# Patient Record
Sex: Female | Born: 2005
Health system: Southern US, Community
[De-identification: ages and names within clinical notes are randomized; demographics above are authoritative.]

## PROBLEM LIST (undated history)

## (undated) DIAGNOSIS — F32A Depression, unspecified: Secondary | ICD-10-CM

## (undated) DIAGNOSIS — F419 Anxiety disorder, unspecified: Secondary | ICD-10-CM

## (undated) DIAGNOSIS — J45909 Unspecified asthma, uncomplicated: Secondary | ICD-10-CM

## (undated) DIAGNOSIS — F329 Major depressive disorder, single episode, unspecified: Secondary | ICD-10-CM

## (undated) DIAGNOSIS — R519 Headache, unspecified: Secondary | ICD-10-CM

## (undated) HISTORY — PX: TONSILLECTOMY: SUR1361

---
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
## 2024-01-09 ENCOUNTER — Ambulatory Visit: Payer: Self-pay

## 2024-02-26 ENCOUNTER — Ambulatory Visit
Admission: RE | Admit: 2024-02-26 | Discharge: 2024-02-26 | Disposition: A | Discharge status: Home / Self Care | Source: Ambulatory Visit | Attending: Family Medicine | Admitting: Family Medicine

## 2024-02-26 ENCOUNTER — Ambulatory Visit (INDEPENDENT_AMBULATORY_CARE_PROVIDER_SITE_OTHER)

## 2024-02-26 ENCOUNTER — Other Ambulatory Visit: Payer: Self-pay

## 2024-02-26 VITALS — BP 125/77 | HR 79 | Temp 98.3°F | Resp 18

## 2024-02-26 DIAGNOSIS — M79644 Pain in right finger(s): Secondary | ICD-10-CM

## 2024-02-26 MED ORDER — DICLOFENAC SODIUM 75 MG PO TBEC: 75.0000 mg | DELAYED_RELEASE_TABLET | Freq: Two times a day (BID) | ORAL | 0 refills | Status: AC

## 2024-02-26 NOTE — ED Triage Notes (Addendum)
 Got hurt changing a bar at the gym. It is now swollen and a bit purple. Hurts anytime I touch it against something. - Entered by patient  Injured at gym yesterday. Swelling and bruising noted to right ring finger. Abrasion to right middle finger, no active bleeding. States she was changing the bar on the lat pull down machine and her finger got caught in the ring and was pulled.

## 2024-02-26 NOTE — Discharge Instructions (Signed)
 Wear your finger splint for up to one week. May remove occasionally.

## 2024-02-26 NOTE — ED Provider Notes (Signed)
 [facility]   [id number] 02/26/24 Arrival Time: 1656  ASSESSMENT & PLAN:  1. Pain in right finger(s)    I have personally viewed and independently interpreted the imaging studies ordered this visit. R hand: no acute bony abnormalities appreciated.  Begin: New Prescriptions   DICLOFENAC  (VOLTAREN ) 75 MG EC TABLET    Take 1 tablet (75 mg total) by mouth 2 (two) times daily.    Orders Placed This Encounter  Procedures   DG Hand Complete Right   Apply finger splint static  - for comfort  Work/school excuse note: not needed. Recommend:  Follow-up Information     [facility]).   Specialty: Urgent Care Why: If worsening or failing to improve as anticipated. Contact information: [address]   [address] [phone]                Reviewed expectations re: course of current medical issues. Questions answered. Outlined signs and symptoms indicating need for more acute intervention. Patient verbalized understanding. After Visit Summary given.  SUBJECTIVE: History from: patient. Laurie Jimenez is a 18 y.o. female who reports pain of R 3rd and 4th distal fingers; mainly 4th. Reprots changing a bar at the gym and getting fingers temporarily caught. Some swelling now. Denies extremity sensation changes or weakness.  No tx PTA.  Past Surgical History:  Procedure Laterality Date   TONSILLECTOMY        OBJECTIVE:  Vitals:   02/26/24 1701  BP: 125/77  Pulse: 79  Resp: 18  Temp: 98.3 F (36.8 C)  TempSrc: Oral  SpO2: 99%    General appearance: alert; no distress HEENT: [place]; AT Neck: supple with FROM Resp: unlabored respirations Extremities: RUE: warm with well perfused appearance; fairly well localized moderate tenderness over right distal 3rd and 4th fingers, mainly 4th; without gross deformities; swelling: minimal; bruising: none; all fingers with FROM; normal distal  sensation Psychological: alert and cooperative; normal mood and affect  Imaging: DG Hand Complete Right Result Date: 02/26/2024 CLINICAL DATA:  Swelling bruising of the right ring finger and abrasion to the right middle finger after injury at the gym EXAM: RIGHT HAND - COMPLETE 3 VIEW COMPARISON:  None Available. FINDINGS: There is no evidence of fracture or dislocation. There is no evidence of arthropathy or other focal bone abnormality. Soft tissues are unremarkable. IMPRESSION: No acute fracture or dislocation. Electronically Signed   By: Limin  Xu M.D.   On: 02/26/2024 17:39      Allergies  Allergen Reactions   Lidocaine Palpitations    Past Medical History:  Diagnosis Date   Anxiety    Asthma    Depression    Headache    Social History   Socioeconomic History   Marital status: Single    Spouse name: Not on file   Number of children: Not on file   Years of education: Not on file   Highest education level: Not on file  Occupational History   Not on file  Tobacco Use   Smoking status: Never   Smokeless tobacco: Never  Vaping Use   Vaping status: Never Used  Substance and Sexual Activity   Alcohol use: Not Currently    Comment: once per month per pt   Drug use: Not Currently    Types: Marijuana   Sexual activity: Yes    Birth control/protection: None  Other Topics Concern   Not on file  Social History Narrative   Not on  file   Social Drivers of Health   Financial Resource Strain: Not on file  Food Insecurity: Low Risk  (01/13/2024)   Received from [facility]   Hunger Vital Sign    Within the past 12 months, you worried that your food would run out before you got money to buy more: Never true    Within the past 12 months, the food you bought just didn't last and you didn't have money to get more. : Never true  Transportation Needs: No Transportation Needs (01/13/2024)   Received from [facility]    In the past 12 months, has lack of  reliable transportation kept you from medical appointments, meetings, work or from getting things needed for daily living? : No  Physical Activity: Not on file  Stress: Not on file  Social Connections: Not on file   Family History  Family history unknown: Yes   Past Surgical History:  Procedure Laterality Date   TONSILLECTOMY         Rolinda Rogue, MD 02/26/24 [phone]

## 2024-04-16 DIAGNOSIS — G43709 Chronic migraine without aura, not intractable, without status migrainosus: Secondary | ICD-10-CM | POA: Insufficient documentation

## 2024-04-16 DIAGNOSIS — M778 Other enthesopathies, not elsewhere classified: Secondary | ICD-10-CM | POA: Insufficient documentation

## 2024-06-07 ENCOUNTER — Encounter: Payer: Self-pay | Admitting: Emergency Medicine

## 2024-06-07 ENCOUNTER — Ambulatory Visit: Admission: EM | Admit: 2024-06-07 | Discharge: 2024-06-07 | Disposition: A | Discharge status: Home / Self Care

## 2024-06-07 DIAGNOSIS — J069 Acute upper respiratory infection, unspecified: Secondary | ICD-10-CM | POA: Diagnosis not present

## 2024-06-07 LAB — POC COVID19/FLU A&B COMBO
Covid Antigen, POC: NEGATIVE
Influenza A Antigen, POC: NEGATIVE
Influenza B Antigen, POC: NEGATIVE

## 2024-06-07 NOTE — ED Triage Notes (Signed)
 Pt presents c/o URI x 3 days. Pt states,  Saturday night I started to get body aches but I took Tylenol  and that went away. I've had a cough, runny nose, and headaches as well.  Pt denies emesis and diarrhea.

## 2024-06-07 NOTE — ED Provider Notes (Signed)
 [facility]    CSN: [id number] Arrival date & time: 06/07/24  9164      History   Chief Complaint Chief Complaint  Patient presents with   URI    HPI Laurie Jimenez is a 18 y.o. female.   Patient presents today due to nasal congestion and cough that started on Friday.  Patient states that she took Tylenol  and ibuprofen  for symptoms with no significant relief.  Patient denies fever but states that she experienced chills.  Patient is eating and drinking without issue and denies shortness of breath.  The history is provided by the patient.  URI   Past Medical History:  Diagnosis Date   Anxiety    Asthma    Depression    Headache     Patient Active Problem List   Diagnosis Date Noted   Chronic migraine without aura without status migrainosus, not intractable 04/16/2024   Left wrist tendinitis 04/16/2024   Combined abdominal and pelvic pain 10/02/2023   Dyspareunia, female 10/02/2023   Non-specific low back pain 10/02/2023   Panic disorder (episodic paroxysmal anxiety) 08/26/2023   Mixed obsessional thoughts and acts 08/26/2023   History of depression 06/13/2022   MDD (major depressive disorder), recurrent severe, without psychosis (HCC) 12/23/2018   Intentional overdose of selective serotonin reuptake inhibitor (SSRI) (HCC) 12/23/2018   Self-injurious behavior 12/23/2018   Cannabis use disorder, mild, abuse 12/23/2018    Past Surgical History:  Procedure Laterality Date   TONSILLECTOMY      OB History   No obstetric history on file.      Home Medications    Prior to Admission medications   Medication Sig Start Date End Date Taking? Authorizing Provider  albuterol (PROVENTIL) (2.5 MG/3ML) 0.083% nebulizer solution USE 1 UNIT VIA NEBULIZER EVERY 4 TO 6 HOURS AS NEEDED 02/13/22  Yes [provider]  albuterol (VENTOLIN HFA) 108 (90 Base) MCG/ACT inhaler INHALE 2 PUFFS BY MOUTH EVERY 4 TO 6 AS NEEDED 02/13/22  Yes [provider]   cetirizine (ZYRTEC) 10 MG tablet Take 10 mg by mouth daily. 03/14/22 03/16/25 Yes [provider]  fluconazole (DIFLUCAN) 150 MG tablet Take 150 mg by mouth once. 05/06/24  Yes [provider]  FLUoxetine (PROZAC) 10 MG capsule Take 10 mg by mouth every morning. 05/18/24  Yes [provider]  FLUoxetine (PROZAC) 20 MG capsule Take 20 mg by mouth every morning. 05/18/24  Yes [provider]  fluticasone (FLONASE) 50 MCG/ACT nasal spray INSTILL 1 SPRAY INTO THE EFFECTED NOSTRILS TWICE DAILY 06/13/22  Yes [provider]  ibuprofen  (ADVIL ) 600 MG tablet Take 600 mg by mouth. 01/13/24  Yes [provider]  loratadine-pseudoephedrine (CLARITIN-D 12-HOUR) 5-120 MG tablet Take 1 tablet by mouth. 06/13/22  Yes [provider]  Norgestimate-Eth Estradiol (TRI-LO-MILI) 0.18/0.215/0.25 MG-25 MCG TABS Take 1 tablet by mouth daily. 01/31/22  Yes [provider]  Olopatadine HCl 0.2 % SOLN Apply 1 drop to eye. 01/13/24  Yes [provider]  rizatriptan (MAXALT) 10 MG tablet Take 10 mg by mouth. 04/16/24 04/16/25 Yes [provider]  topiramate (TOPAMAX) 25 MG tablet Take 50 mg by mouth. 04/16/24  Yes [provider]  diclofenac  (VOLTAREN ) 75 MG EC tablet Take 1 tablet (75 mg total) by mouth 2 (two) times daily. 02/26/24   Rolinda Rogue, MD  escitalopram  (LEXAPRO ) 10 MG tablet Take 1 tablet by mouth daily.    [provider]  hydrOXYzine  (ATARAX ) 25 MG tablet Take 25 mg  by mouth.    [provider]  omeprazole (PRILOSEC) 20 MG capsule Take 20 mg by mouth daily.    [provider]  ondansetron  (ZOFRAN ) 4 MG tablet Take 1 tablet (4 mg total) by mouth every 6 (six) hours. 07/15/22   Ettie Gull, MD  ondansetron  (ZOFRAN -ODT) 4 MG disintegrating tablet Take 1 tablet (4 mg total) by mouth every 8 (eight) hours as needed for nausea or vomiting. 06/14/23   Beverley Leita LABOR, PA-C  pantoprazole  (PROTONIX ) 40  MG tablet Take 1 tablet (40 mg total) by mouth daily. 06/14/23 07/14/23  Beverley Leita LABOR, PA-C    Family History Family History  Family history unknown: Yes    Social History Social History   Tobacco Use   Smoking status: Never    Passive exposure: Never   Smokeless tobacco: Never  Vaping Use   Vaping status: Never Used  Substance Use Topics   Alcohol use: Not Currently    Comment: once per month per pt   Drug use: Not Currently    Types: Marijuana     Allergies   Lidocaine   Review of Systems Review of Systems   Physical Exam Triage Vital Signs ED Triage Vitals  Encounter Vitals Group     BP 06/07/24 1041 107/69     Girls Systolic BP Percentile --      Girls Diastolic BP Percentile --      Boys Systolic BP Percentile --      Boys Diastolic BP Percentile --      Pulse Rate 06/07/24 1041 68     Resp 06/07/24 1041 16     Temp 06/07/24 1041 98.1 F (36.7 C)     Temp Source 06/07/24 1041 Oral     SpO2 06/07/24 1041 97 %     Weight 06/07/24 1041 113 lb 1.5 oz (51.3 kg)     Height --      Head Circumference --      Peak Flow --      Pain Score 06/07/24 1040 0     Pain Loc --      Pain Education --      Exclude from Growth Chart --    No data found.  Updated Vital Signs BP 107/69 (BP Location: Left Arm)   Pulse 68   Temp 98.1 F (36.7 C) (Oral)   Resp 16   Wt 113 lb 1.5 oz (51.3 kg)   LMP 06/05/2024 (Exact Date)   SpO2 97%   Visual Acuity Right Eye Distance:   Left Eye Distance:   Bilateral Distance:    Right Eye Near:   Left Eye Near:    Bilateral Near:     Physical Exam Vitals and nursing note reviewed.  Constitutional:      General: She is not in acute distress.    Appearance: Normal appearance. She is not ill-appearing, toxic-appearing or diaphoretic.  HENT:     Nose: Congestion (moderately enlarged turbinates) present. No rhinorrhea.     Mouth/Throat:     Mouth: Mucous membranes are moist.     Pharynx: Oropharynx is clear. No  oropharyngeal exudate or posterior oropharyngeal erythema.  Eyes:     General: No scleral icterus. Cardiovascular:     Rate and Rhythm: Normal rate and regular rhythm.     Heart sounds: Normal heart sounds.  Pulmonary:     Effort: Pulmonary effort is normal. No respiratory distress.     Breath sounds: Normal breath sounds. No wheezing or  rhonchi.  Skin:    General: Skin is warm.  Neurological:     Mental Status: She is alert and oriented to person, place, and time.  Psychiatric:        Mood and Affect: Mood normal.        Behavior: Behavior normal.      UC Treatments / Results  Labs (all labs ordered are listed, but only abnormal results are displayed) Labs Reviewed  POC COVID19/FLU A&B COMBO - Normal    EKG   Radiology No results found.  Procedures Procedures (including critical care time)  Medications Ordered in UC Medications - No data to display  Initial Impression / Assessment and Plan / UC Course  I have reviewed the triage vital signs and the nursing notes.  Pertinent labs & imaging results that were available during my care of the patient were reviewed by me and considered in my medical decision making (see chart for details).      Final Clinical Impressions(s) / UC Diagnoses   Final diagnoses:  Viral URI     Discharge Instructions      You been diagnosed with a viral illness today. -Viruses have to run their course and medicines that are prescribed are meant to help with symptoms. - With viruses usually feel poorly from 3 to 7 days with cough being the last symptoms to resolve.  -Cough can linger from days to weeks.  Antibiotics are not effective for viruses. -If your cough lasts more than 2 weeks and you are coughing so hard that you are vomiting or feel like you could pass out we need to follow-up with PCP for further testing and evaluation. -Rest, increase water intake, may use pseudoephedrine for nasal congestion, Delsym (dextromethorphan) or  honey as needed for cough, and ibuprofen  and/or Tylenol  as directed on packaging for pain and fever. -If you have hypertension you should take Coricidin or other OTC meds approved for people with high blood pressure. -You may use a spoonful of honey every 4-6 hours as needed for throat pain and cough. -Warm tea with honey and lemon are helpful for soothe throat as well.  Chloraseptic and Cepacol make a throat lozenge with numbing medication, can be purchased over-the-counter. -May also use Flonase or sinus rinse for sinus pressure or nasal congestion.  Be sure to use distilled bottled water for sinus rinses. -May use coolmist humidifier to open up nasal passages -May elevate head to assist with postnasal drainage. -If you feel poorly (fever, fatigue, shortness of breath, nausea, etc.) for more than 10 days to be sure to follow-up with PCP or in clinic for further evaluation and additional treatments. If you experience chest pain with shortness of breath or pulse oxygen less than 95% you should report to the ER.     ED Prescriptions   None    PDMP not reviewed this encounter.   Andra Corean BROCKS, PA-C 06/07/24 1121

## 2024-06-07 NOTE — Discharge Instructions (Signed)

## 2024-07-19 ENCOUNTER — Ambulatory Visit
Admission: EM | Admit: 2024-07-19 | Discharge: 2024-07-19 | Disposition: A | Discharge status: Home / Self Care | Attending: Student | Admitting: Student

## 2024-07-19 ENCOUNTER — Encounter: Payer: Self-pay | Admitting: *Deleted

## 2024-07-19 DIAGNOSIS — J069 Acute upper respiratory infection, unspecified: Secondary | ICD-10-CM | POA: Diagnosis not present

## 2024-07-19 DIAGNOSIS — Z3A01 Less than 8 weeks gestation of pregnancy: Secondary | ICD-10-CM | POA: Diagnosis not present

## 2024-07-19 DIAGNOSIS — Z20828 Contact with and (suspected) exposure to other viral communicable diseases: Secondary | ICD-10-CM

## 2024-07-19 LAB — POC COVID19/FLU A&B COMBO
Covid Antigen, POC: NEGATIVE
Influenza A Antigen, POC: NEGATIVE
Influenza B Antigen, POC: NEGATIVE

## 2024-07-19 MED ORDER — DOXYLAMINE-PYRIDOXINE 10-10 MG PO TBEC: 2.0000 | DELAYED_RELEASE_TABLET | Freq: Every day | ORAL | 1 refills | Status: AC

## 2024-07-19 NOTE — ED Provider Notes (Addendum)
 " [facility]    CSN: [id number] Arrival date & time: 07/19/24  9170      History   Chief Complaint Chief Complaint  Patient presents with   Sore Throat   Cough    HPI Laurie Jimenez is a 18 y.o. female presenting w viral symptoms x4 days. H/o asthma, has albuterol inhaler which she has not required during present illness. Sore throat Thursday (4 days ago). Cough, runny nose since Saturday 07/17/24 (2 days). Headache since yesterday. States coughed so hard last night that she vomited. No emesis today. Taking tylenol , last dose 1 hour ago. Reports she is [redacted] weeks pregnant. Denies fever.  Cough is nonproductive.  Denies shortness of breath, chest tightness, chest pain. States nausea prior to current illness.   HPI  Past Medical History:  Diagnosis Date   Anxiety    Asthma    Depression    Headache     Patient Active Problem List   Diagnosis Date Noted   Chronic migraine without aura without status migrainosus, not intractable 04/16/2024   Left wrist tendinitis 04/16/2024   Combined abdominal and pelvic pain 10/02/2023   Dyspareunia, female 10/02/2023   Non-specific low back pain 10/02/2023   Panic disorder (episodic paroxysmal anxiety) 08/26/2023   Mixed obsessional thoughts and acts 08/26/2023   History of depression 06/13/2022   MDD (major depressive disorder), recurrent severe, without psychosis (HCC) 12/23/2018   Intentional overdose of selective serotonin reuptake inhibitor (SSRI) (HCC) 12/23/2018   Self-injurious behavior 12/23/2018   Cannabis use disorder, mild, abuse 12/23/2018    Past Surgical History:  Procedure Laterality Date   TONSILLECTOMY      OB History     Gravida  1   Para      Term      Preterm      AB      Living         SAB      IAB      Ectopic      Multiple      Live Births               Home Medications    Prior to Admission medications  Medication Sig Start Date End Date Taking? Authorizing  Provider  Doxylamine -Pyridoxine  (DICLEGIS ) 10-10 MG TBEC Take 2 tablets by mouth daily. Start with 2 delayed-release tablets by mouth on a daily basis at bedtime If symptoms not adequately controlled, increase dose to 4 tablets each day (1 tab in AM, 1 tab mid-afternoon, and 2 tabs at bedtime) 07/19/24  Yes Anjelina Dung E, PA-C  albuterol (PROVENTIL) (2.5 MG/3ML) 0.083% nebulizer solution USE 1 UNIT VIA NEBULIZER EVERY 4 TO 6 HOURS AS NEEDED 02/13/22   [provider]  albuterol (VENTOLIN HFA) 108 (90 Base) MCG/ACT inhaler INHALE 2 PUFFS BY MOUTH EVERY 4 TO 6 AS NEEDED 02/13/22   [provider]  cetirizine (ZYRTEC) 10 MG tablet Take 10 mg by mouth daily. 03/14/22 03/16/25  [provider]  diclofenac  (VOLTAREN ) 75 MG EC tablet Take 1 tablet (75 mg total) by mouth 2 (two) times daily. 02/26/24   Rolinda Rogue, MD  escitalopram  (LEXAPRO ) 10 MG tablet Take 1 tablet by mouth daily.    [provider]  fluconazole (DIFLUCAN) 150 MG tablet Take 150 mg by mouth once. 05/06/24   [provider]  FLUoxetine (PROZAC) 10 MG capsule Take 10 mg by mouth every morning. 05/18/24   [provider]  FLUoxetine (PROZAC) 20 MG  capsule Take 20 mg by mouth every morning. 05/18/24   [provider]  fluticasone (FLONASE) 50 MCG/ACT nasal spray INSTILL 1 SPRAY INTO THE EFFECTED NOSTRILS TWICE DAILY 06/13/22   [provider]  hydrOXYzine  (ATARAX ) 25 MG tablet Take 25 mg by mouth.    [provider]  ibuprofen  (ADVIL ) 600 MG tablet Take 600 mg by mouth. 01/13/24   [provider]  loratadine-pseudoephedrine (CLARITIN-D 12-HOUR) 5-120 MG tablet Take 1 tablet by mouth. 06/13/22   [provider]  Norgestimate-Eth Estradiol (TRI-LO-MILI) 0.18/0.215/0.25 MG-25 MCG TABS Take 1 tablet by mouth daily. 01/31/22   [provider]  Olopatadine HCl 0.2 % SOLN Apply 1 drop to eye. 01/13/24   [provider]  omeprazole  (PRILOSEC) 20 MG capsule Take 20 mg by mouth daily.    [provider]  ondansetron  (ZOFRAN ) 4 MG tablet Take 1 tablet (4 mg total) by mouth every 6 (six) hours. 07/15/22   Ettie Gull, MD  ondansetron  (ZOFRAN -ODT) 4 MG disintegrating tablet Take 1 tablet (4 mg total) by mouth every 8 (eight) hours as needed for nausea or vomiting. 06/14/23   Beverley Leita LABOR, PA-C  pantoprazole  (PROTONIX ) 40 MG tablet Take 1 tablet (40 mg total) by mouth daily. 06/14/23 07/14/23  Beverley Leita LABOR, PA-C  rizatriptan (MAXALT) 10 MG tablet Take 10 mg by mouth. 04/16/24 04/16/25  [provider]  topiramate (TOPAMAX) 25 MG tablet Take 50 mg by mouth. 04/16/24   [provider]    Family History Family History  Family history unknown: Yes    Social History Social History[1]   Allergies   Lidocaine   Review of Systems Review of Systems  Constitutional:  Positive for fatigue. Negative for appetite change, chills and fever.  HENT:  Positive for congestion and sore throat. Negative for ear pain, rhinorrhea, sinus pressure and sinus pain.   Eyes:  Negative for redness and visual disturbance.  Respiratory:  Positive for cough. Negative for chest tightness, shortness of breath and wheezing.   Cardiovascular:  Negative for chest pain and palpitations.  Gastrointestinal:  Positive for nausea and vomiting. Negative for abdominal pain, constipation and diarrhea.  Genitourinary:  Negative for dysuria, frequency and urgency.  Musculoskeletal:  Negative for myalgias.  Neurological:  Positive for headaches. Negative for dizziness and weakness.  Psychiatric/Behavioral:  Negative for confusion.   All other systems reviewed and are negative.    Physical Exam Triage Vital Signs ED Triage Vitals  Encounter Vitals Group     BP      Girls Systolic BP Percentile      Girls Diastolic BP Percentile      Boys Systolic BP Percentile      Boys Diastolic BP Percentile      Pulse      Resp       Temp      Temp src      SpO2      Weight      Height      Head Circumference      Peak Flow      Pain Score      Pain Loc      Pain Education      Exclude from Growth Chart    No data found.  Updated Vital Signs BP 123/69 (BP Location: Left Arm)   Pulse 84   Temp 98.4 F (36.9 C) (Oral)   Resp 18   LMP 06/05/2024   SpO2 99%   Visual Acuity  Right Eye Distance:   Left Eye Distance:   Bilateral Distance:    Right Eye Near:   Left Eye Near:    Bilateral Near:     Physical Exam Vitals reviewed.  Constitutional:      General: She is not in acute distress.    Appearance: Normal appearance. She is not ill-appearing.  HENT:     Head: Normocephalic and atraumatic.     Right Ear: Tympanic membrane, ear canal and external ear normal. No tenderness. No middle ear effusion. There is no impacted cerumen. Tympanic membrane is not perforated, erythematous, retracted or bulging.     Left Ear: Tympanic membrane, ear canal and external ear normal. No tenderness.  No middle ear effusion. There is no impacted cerumen. Tympanic membrane is not perforated, erythematous, retracted or bulging.     Nose: Nose normal. No congestion.     Mouth/Throat:     Mouth: Mucous membranes are moist.     Pharynx: Uvula midline. Posterior oropharyngeal erythema present. No oropharyngeal exudate.     Tonsils: No tonsillar exudate.     Comments: Posterior oropharyngeal erythema Tonsils surgically absent Eyes:     Extraocular Movements: Extraocular movements intact.     Pupils: Pupils are equal, round, and reactive to light.  Cardiovascular:     Rate and Rhythm: Normal rate and regular rhythm.     Heart sounds: Normal heart sounds.  Pulmonary:     Effort: Pulmonary effort is normal.     Breath sounds: Normal breath sounds. No decreased breath sounds, wheezing, rhonchi or rales.  Abdominal:     Palpations: Abdomen is soft.     Tenderness: There is no abdominal tenderness. There is no guarding or  rebound.     Comments: No pain to palpation  Lymphadenopathy:     Cervical: No cervical adenopathy.     Right cervical: No superficial, deep or posterior cervical adenopathy.    Left cervical: No superficial, deep or posterior cervical adenopathy.  Skin:    Comments: No rash   Neurological:     General: No focal deficit present.     Mental Status: She is alert and oriented to person, place, and time.  Psychiatric:        Mood and Affect: Mood normal.        Behavior: Behavior normal.        Thought Content: Thought content normal.        Judgment: Judgment normal.      UC Treatments / Results  Labs (all labs ordered are listed, but only abnormal results are displayed) Labs Reviewed  POC COVID19/FLU A&B COMBO - Normal    EKG   Radiology No results found.  Procedures Procedures (including critical care time)  Medications Ordered in UC Medications - No data to display  Initial Impression / Assessment and Plan / UC Course  I have reviewed the triage vital signs and the nursing notes.  Pertinent labs & imaging results that were available during my care of the patient were reviewed by me and considered in my medical decision making (see chart for details).     Patient is a pleasant 18 y.o. female presenting with flu-like illness, following exposure to influenza A. The patient is afebrile and nontachycardic.  Last acetaminophen  was 1 hour ago. She is [redacted] weeks pregnant.   -Covid negative -Influenza negative  She is currently [redacted] weeks pregnant, and has been dealing with morning sickness, which is worse during present illness.  Diclegis  sent.  Will manage symptomatically with Tylenol , humidifier, throat lozenges, warm tea, etc. She has a history of mild intermittent asthma, and has not required her albuterol during present illness.  Discussed that while albuterol is not considered completely safe in pregnancy, if she is short of breath or wheezing, then she should use  this.   Final Clinical Impressions(s) / UC Diagnoses   Final diagnoses:  Exposure to influenza  Viral URI with cough  [redacted] weeks gestation of pregnancy     Discharge Instructions      -Your COVID and influenza tests were negative. -You have a virus, like the common cold.  Viruses typically last 5 to 7 days.  After 7 days, your symptoms should be improving rather than worsening.  If your symptoms improve, and then worsen again, this is when we worry about a sinus infection or a lung infection, and you should return for additional care. -For nausea and vomiting: start the diclegis . Take 2 tablets by mouth daily. Start with 2 delayed-release tablets by mouth on a daily basis at bedtime If symptoms not adequately controlled, increase dose to 4 tablets each day (1 tab in AM, 1 tab mid-afternoon, and 2 tabs at bedtime) -Tylenol  for fevers/chills, bodyaches, and headaches -Throat lozenges, humidifier, warm tea, etc        ED Prescriptions     Medication Sig Dispense Auth. Provider   Doxylamine -Pyridoxine  (DICLEGIS ) 10-10 MG TBEC Take 2 tablets by mouth daily. Start with 2 delayed-release tablets by mouth on a daily basis at bedtime If symptoms not adequately controlled, increase dose to 4 tablets each day (1 tab in AM, 1 tab mid-afternoon, and 2 tabs at bedtime) 30 tablet Hinton Luellen E, PA-C      PDMP not reviewed this encounter.    Arlyss Leita BRAVO, PA-C 07/19/24 0941     [1]  Social History Tobacco Use   Smoking status: Never    Passive exposure: Never   Smokeless tobacco: Never  Vaping Use   Vaping status: Never Used  Substance Use Topics   Alcohol use: Not Currently    Comment: once per month per pt   Drug use: Not Currently    Types: Marijuana     Arlyss Leita BRAVO, PA-C 07/19/24 1001  "

## 2024-07-19 NOTE — Discharge Instructions (Addendum)
-  Your COVID and influenza tests were negative. -You have a virus, like the common cold.  Viruses typically last 5 to 7 days.  After 7 days, your symptoms should be improving rather than worsening.  If your symptoms improve, and then worsen again, this is when we worry about a sinus infection or a lung infection, and you should return for additional care. -For nausea and vomiting: start the diclegis . Take 2 tablets by mouth daily. Start with 2 delayed-release tablets by mouth on a daily basis at bedtime If symptoms not adequately controlled, increase dose to 4 tablets each day (1 tab in AM, 1 tab mid-afternoon, and 2 tabs at bedtime) -Tylenol  for fevers/chills, bodyaches, and headaches -Throat lozenges, humidifier, warm tea, etc

## 2024-07-19 NOTE — ED Triage Notes (Addendum)
 Sore throat Thursday. Cough, runny nose since Saturday. Headache since yesterday. Taking tylenol , last dose 1 hour ago. Reports she is [redacted] weeks pregnant. Denies fever. Family member have the flu
